# Patient Record
Sex: Male | Born: 1945 | Hispanic: Yes | Marital: Married
Health system: Southern US, Community
[De-identification: ages and names within clinical notes are randomized; demographics above are authoritative.]

---

## 2021-01-03 ENCOUNTER — Emergency Department (HOSPITAL_BASED_OUTPATIENT_CLINIC_OR_DEPARTMENT_OTHER)
Admission: EM | Admit: 2021-01-03 | Discharge: 2021-01-03 | Disposition: A | Discharge status: Home / Self Care | Payer: Medicare HMO | Attending: Emergency Medicine | Admitting: Emergency Medicine

## 2021-01-03 ENCOUNTER — Emergency Department (HOSPITAL_BASED_OUTPATIENT_CLINIC_OR_DEPARTMENT_OTHER): Payer: Medicare HMO

## 2021-01-03 ENCOUNTER — Other Ambulatory Visit: Payer: Self-pay

## 2021-01-03 DIAGNOSIS — R059 Cough, unspecified: Secondary | ICD-10-CM | POA: Insufficient documentation

## 2021-01-03 DIAGNOSIS — Z20822 Contact with and (suspected) exposure to covid-19: Secondary | ICD-10-CM | POA: Insufficient documentation

## 2021-01-03 DIAGNOSIS — J029 Acute pharyngitis, unspecified: Secondary | ICD-10-CM | POA: Insufficient documentation

## 2021-01-03 DIAGNOSIS — R0981 Nasal congestion: Secondary | ICD-10-CM | POA: Diagnosis not present

## 2021-01-03 LAB — RESP PANEL BY RT-PCR (FLU A&B, COVID) ARPGX2
Influenza A by PCR: NEGATIVE
Influenza B by PCR: NEGATIVE
SARS Coronavirus 2 by RT PCR: NEGATIVE

## 2021-01-03 MED ORDER — HYDROCOD POLST-CPM POLST ER 10-8 MG/5ML PO SUER
5.0000 mL | Freq: Once | ORAL | Status: AC
  Administered 2021-01-03: 5 mL via ORAL
  Filled 2021-01-03: qty 5

## 2021-01-03 NOTE — ED Notes (Signed)
Pt verbalizes understanding of discharge instructions. Opportunity for questioning and answers were provided. Armand removed by staff, pt discharged from ED to home. Instructed to f/u if sx are worse.

## 2021-01-03 NOTE — ED Provider Notes (Signed)
[facility] EMERGENCY DEPT Provider Note   CSN: [id number] Arrival date & time: 01/03/21  1936     History Chief Complaint  Patient presents with  . Sore Throat  . Cough    Arlando Leisinger is a 75 y.o. male w/ htn,hld presenting with a cough for 1 week and a sore throat.  He reports a sore throat is improved over the past 2 days.  He feels a persistent cough, worse at night.  He reports mild congestion.  He denies fevers, chills, headaches, sore throat.  He is in town visiting and planning to fly home on Sunday.  HPI     No past medical history on file.  There are no problems to display for this patient.    No family history on file.     Home Medications Prior to Admission medications   Not on File    Allergies    Patient has no known allergies.  Review of Systems   Review of Systems  Constitutional: Negative for chills and fever.  HENT: Positive for congestion and sore throat. Negative for ear pain.   Respiratory: Positive for cough. Negative for shortness of breath.   Cardiovascular: Negative for chest pain and palpitations.  Gastrointestinal: Negative for nausea and vomiting.  Musculoskeletal: Negative for arthralgias and back pain.  Neurological: Negative for syncope and headaches.  All other systems reviewed and are negative.   Physical Exam Updated Vital Signs BP (!) 155/100 (BP Location: Right Arm)   Pulse 62   Temp 98.3 F (36.8 C) (Oral)   Resp 16   Ht 5\' 7"  (1.702 m)   Wt 78 kg   SpO2 97%   BMI 26.94 kg/m   Physical Exam Constitutional:      General: He is not in acute distress. HENT:     Head: Normocephalic and atraumatic.     Mouth/Throat:     Mouth: Mucous membranes are moist. No oral lesions.     Pharynx: No pharyngeal swelling, oropharyngeal exudate, posterior oropharyngeal erythema or uvula swelling.  Eyes:     Conjunctiva/sclera: Conjunctivae normal.     Pupils: Pupils are equal, round, and reactive to light.   Cardiovascular:     Rate and Rhythm: Normal rate and regular rhythm.  Pulmonary:     Effort: Pulmonary effort is normal. No respiratory distress.  Skin:    General: Skin is warm and dry.  Neurological:     General: No focal deficit present.     Mental Status: He is alert. Mental status is at baseline.  Psychiatric:        Mood and Affect: Mood normal.        Behavior: Behavior normal.     ED Results / Procedures / Treatments   Labs (all labs ordered are listed, but only abnormal results are displayed) Labs Reviewed  RESP PANEL BY RT-PCR (FLU A&B, COVID) [id number]    EKG None  Radiology DG Chest Port 1 View  Result Date: 01/03/2021 CLINICAL DATA:  Cough, sore throat EXAM: PORTABLE CHEST 1 VIEW COMPARISON:  None. FINDINGS: The heart size and mediastinal contours are within normal limits. Both lungs are clear. The visualized skeletal structures are unremarkable. IMPRESSION: No active disease. Electronically Signed   By: 03/05/2021 M.D.   On: 01/03/2021 22:10    Procedures Procedures   Medications Ordered in ED Medications  chlorpheniramine-HYDROcodone (TUSSIONEX) 10-8 MG/5ML suspension 5 mL (5 mLs Oral Given 01/03/21 2046)    ED Course  I  have reviewed the triage vital signs and the nursing notes.  Pertinent labs & imaging results that were available during my care of the patient were reviewed by me and considered in my medical decision making (see chart for details).  This is a well-appearing 75 year old male here with complaint of sore throat and cough, although the symptoms largely resolved.  He said he wanted to come to "just get checked out before fly home".  He had a COVID test performed in triage was negative.  An x-ray was performed which did not show signs of pneumonia.  Clinically have low suspicion for bacterial infection, including strep throat or pneumonia.  Advised continued conservative management at home, can try Flonase for postnasal drip at night, and a  spoonful of honey.  Otherwise he does not have any additional complaints.  He is okay for discharge     Final Clinical Impression(s) / ED Diagnoses Final diagnoses:  Sore throat  Cough    Rx / DC Orders ED Discharge Orders    None       Livia Tarr, Kermit Balo, MD 01/03/21 2357

## 2021-01-03 NOTE — ED Triage Notes (Signed)
Pt to ED from home with c/o cough/sore throat x3 days. Pt states the cough syrup and spray is no longer helping. Pt reports non-productive cough x5 days.

## 2021-01-03 NOTE — Discharge Instructions (Signed)
Your Covid and Influenza test was NEGATIVE (PCR).  Your xray did not show signs of pneumonia.  You likely are recovering from a virus.  This may take a few more days.  Try a spoon of honey at night before bedtime.  You can dry flonase spray in each nose at night, for the next 3 nights, to help prevent post-nasal drip.

## 2023-09-16 IMAGING — MR MRI LSPINE WO CONTRAST
6 series · 34 of 48 positions shown · non-contrast
Comparison: Previous MRI of the lumbar spine March 16, 2020 with correlation with lumbar spine x-rays February 23, 2020 that show 5 lumbar-type vertebrae.

INDICATION: Low back pain with numbness and tingling sensation down to the lower extremities. No history of previous back surgery.
TECHNIQUE: Sagittal and axial multisequence MR images of the lumbar spine were performed without intravenous contrast with additional axial T1-weighted images.

[Series 18: t2_sag · sagittal · 4.0mm · 0.81mm/px · 4 of 15 slices shown]
[im 1/15]
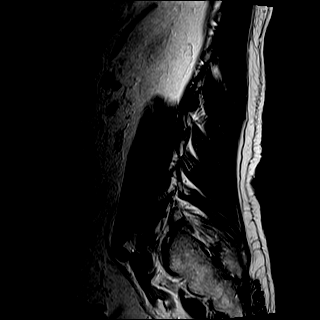
[im 5/15]
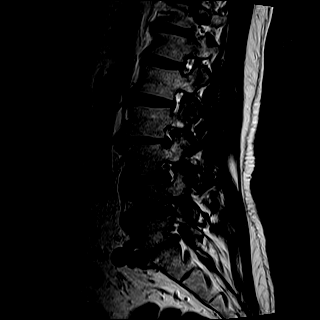
[im 10/15]
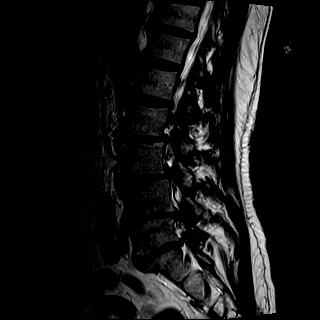
[im 15/15]
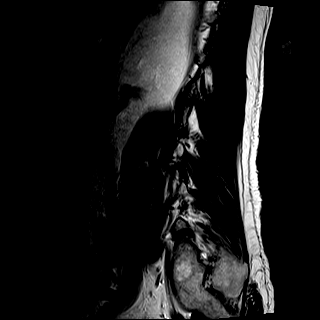

[Series 19: t1_sag · sagittal · 4.0mm · 0.90mm/px · 5 of 15 slices shown]
[im 1/15]
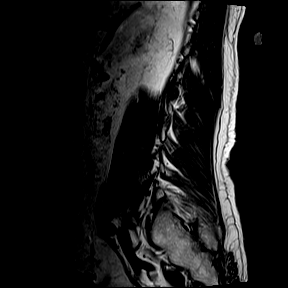
[im 4/15]
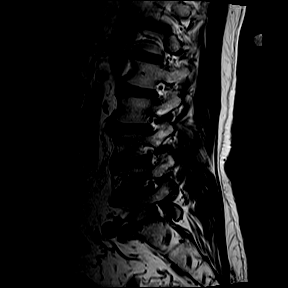
[im 8/15]
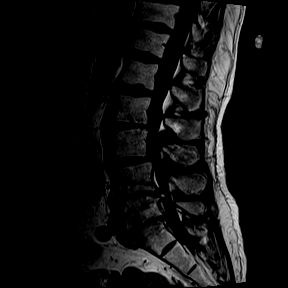
[im 11/15]
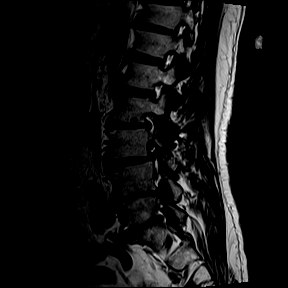
[im 15/15]
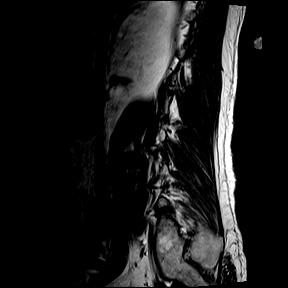

[Series 20: ir_sag · sagittal · 4.0mm · 0.51mm/px · 5 of 15 slices shown]
[im 1/15]
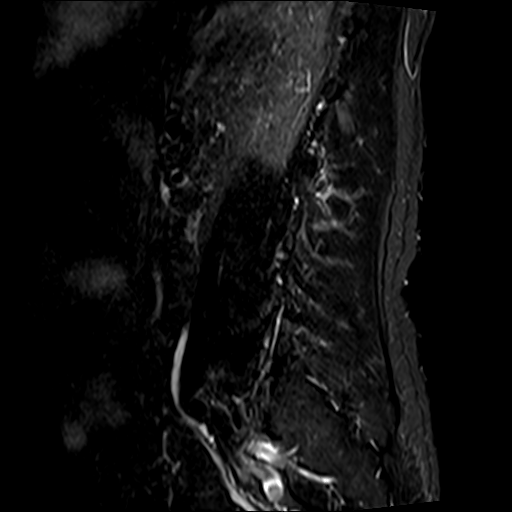
[im 4/15]
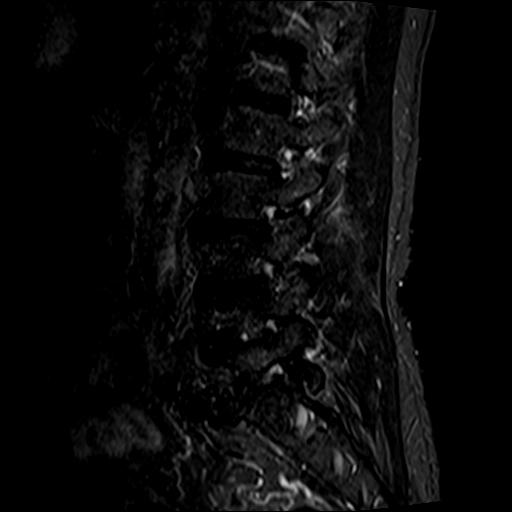
[im 8/15]
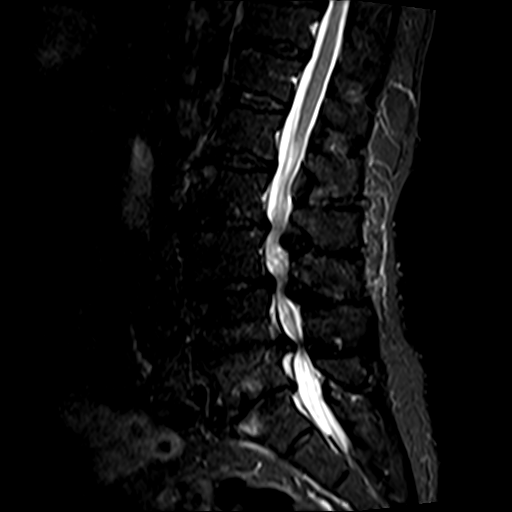
[im 11/15]
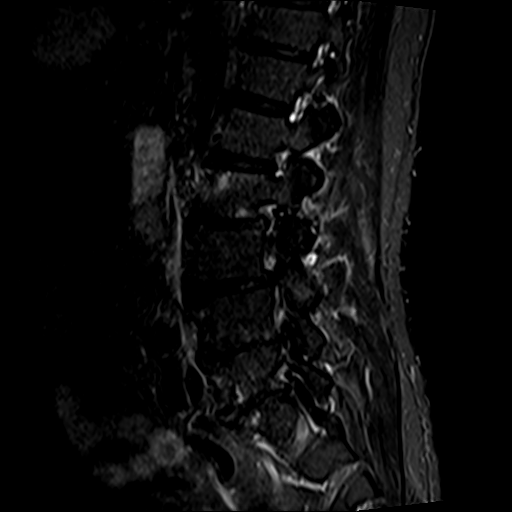
[im 15/15]
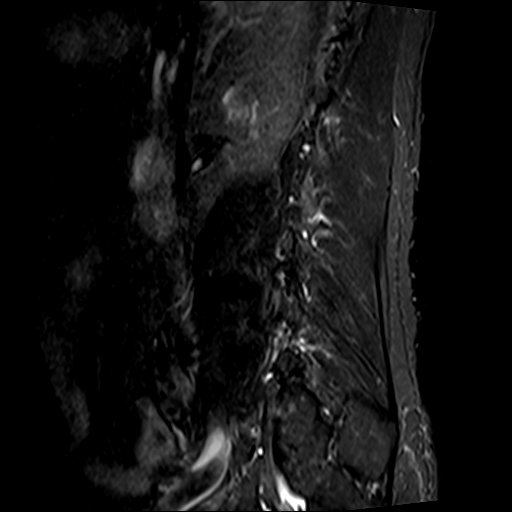

[Series 21: t2_axial · axial · 4.0mm · 0.62mm/px · z∈[-526,-358]mm · 9 of 36 slices shown]
[im 1/36]
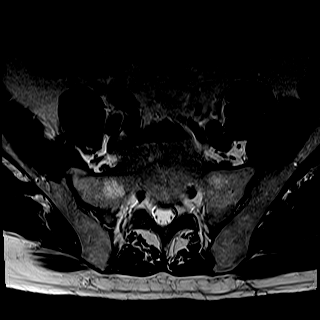
[im 7/36]
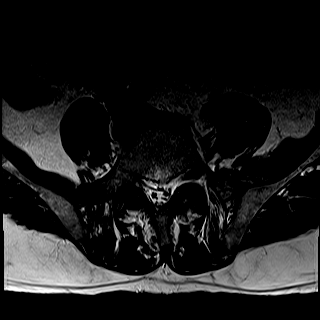
[im 10/36]
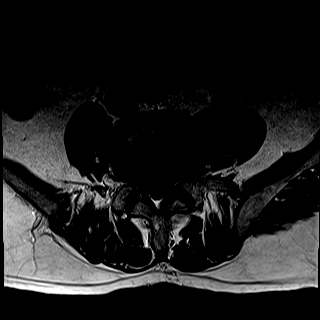
[im 16/36]
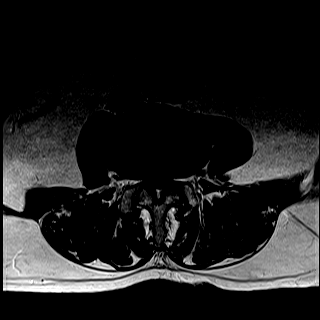
[im 20/36]
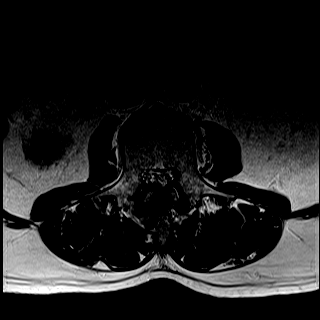
[im 26/36]
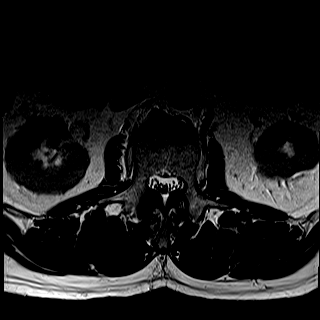
[im 29/36]
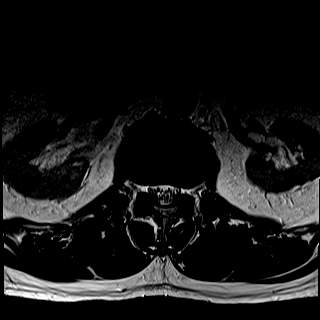
[im 32/36]
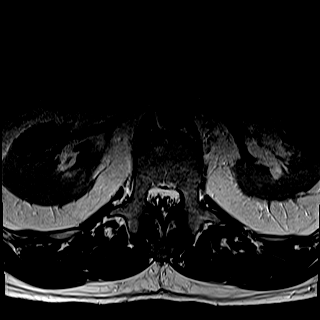
[im 36/36]
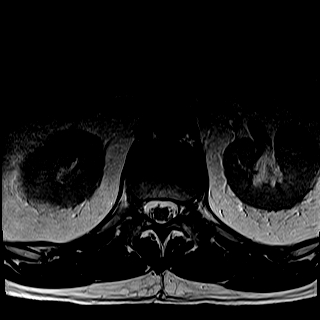

[Series 22: t1_axial_obl · axial · 3.0mm · 0.86mm/px · z∈[-554,-360]mm · 8 of 26 slices shown]
[im 1/26]
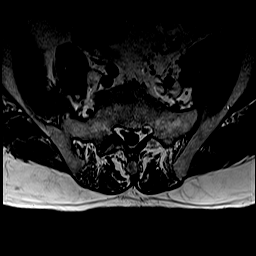
[im 4/26]
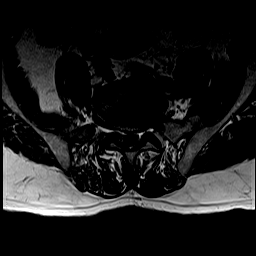
[im 7/26]
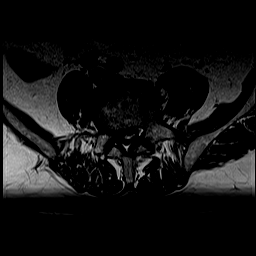
[im 10/26]
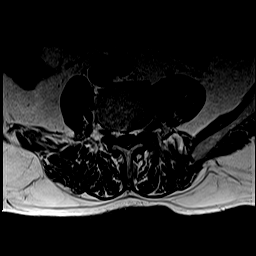
[im 16/26]
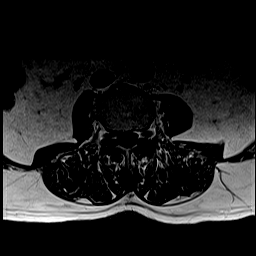
[im 19/26]
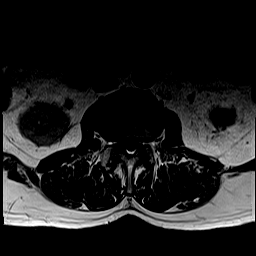
[im 22/26]
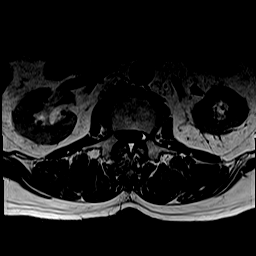
[im 26/26]
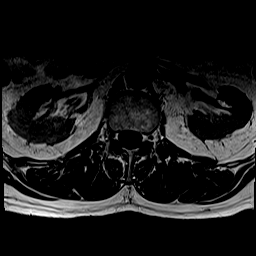

[Series 5011: coronal lspine · coronal · 3.0mm · 0.78mm/px · 3 of 38 slices shown]
[im 1/38]
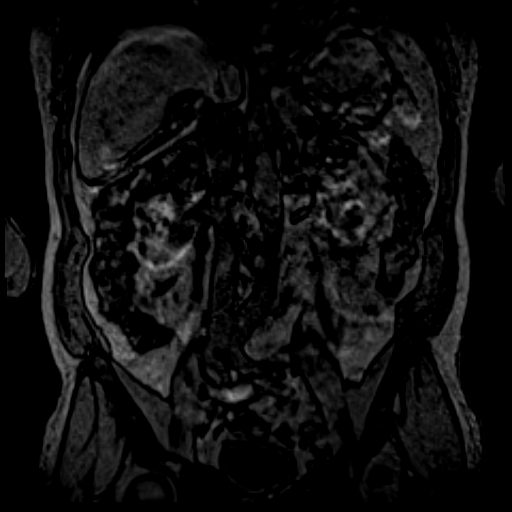
[im 7/38]
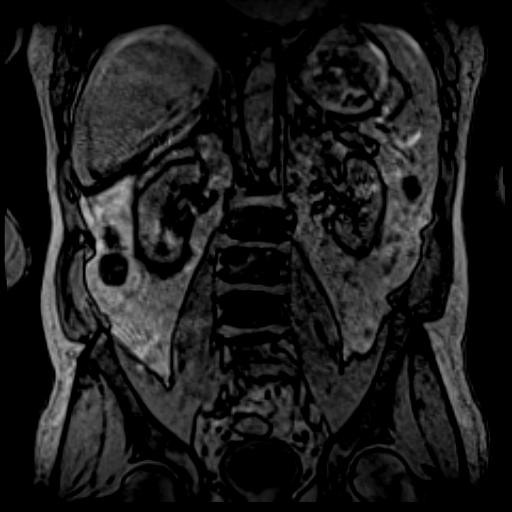
[im 13/38]
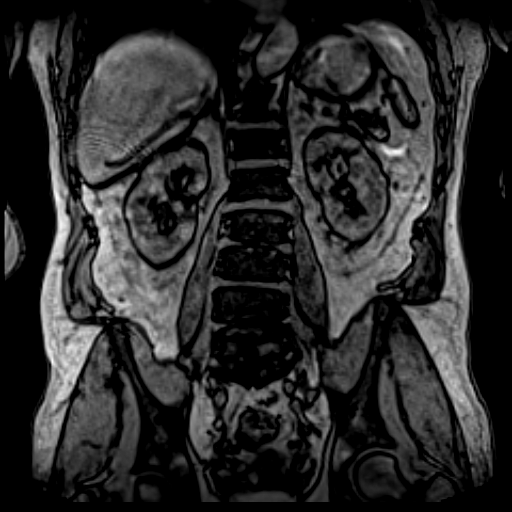

[34 of 48 positions shown; findings below may reference images not displayed]

FINDINGS: From the lateral scout image annotation patient has 7 cervical vertebrae, 12 thoracic vertebrae and 5 lumbar-type vertebrae.

There is mild degenerative anterolisthesis of L2 over L3 and of L3 over L4 due to facet arthropathy. There is disc desiccation seen throughout the visualized lower thoracic and lumbar spine with decrease in disc space seen from L2 down to L5-S1 with degenerative endplate changes seen at L4-5 and L5-S1. These findings are in keeping with degenerative disc disease. Anterior/lateral scattered osteophytes are seen throughout the visualized lower thoracic and lumbar spine in keeping with spondylosis. The signal intensity of the bone marrow and distal neural cord is normal with normal conus ending at the inferior endplate of L1 vertebral body.

At T11-T12 and T12-L1 there is no disc herniation, spinal canal or foraminal stenosis.

At T12-L1 there are mild bilateral degenerative changes of the facets.

At L1-L2 there are moderate bilateral degenerative changes of the facets producing no significant spinal canal or foraminal stenosis.

At L2-L3 there is a broad disc bulge with severe bilateral facet arthropathy and hypertrophy of the ligamentum flavum with minimal degenerative anterolisthesis of L2 over L3. These produce severe segmental spinal canal stenoses with severe bilateral intervertebral neural foraminal stenosis more prominent in the left side.

At L3-L4 there is a broad disc bulge with severe bilateral facet arthropathy with mild degenerative anterolisthesis of L3 over L4 and hypertrophy of the ligamentum flavum. These produce severe segmental spinal canal stenoses with moderate bilateral intervertebral neural foraminal stenosis.

At L4-L5 there is a broad disc bulge with overlying osteophyte with bilateral facet arthropathy. This produces severe segmental spinal canal stenosis with severe bilateral intervertebral neural foraminal stenosis.

At L5-S1 there is a broad disc bulge with moderate bilateral degenerative changes of the facets. This produces mild segmental spinal canal stenoses with severe bilateral intervertebral neural foraminal stenosis.

Bilateral fusion of the sacroiliac joints are seen due to anterior bridging osteophytes.

No significant change is seen when compared to the previous examination.
IMPRESSION: Stable MRI of the lumbar spine with spondylosis, degenerative disc disease and facet arthropathy with mild degenerative anterolisthesis of L2 over L3 and of L3 over L4 due to facet arthropathy. Multilevel severe segmental spinal canal and foraminal stenoses as described.
